# Patient Record
Sex: Female | Born: 1965 | Race: Black or African American | Hispanic: No | Marital: Single
Health system: Southern US, Community
[De-identification: ages and names within clinical notes are randomized; demographics above are authoritative.]

---

## 2013-10-15 ENCOUNTER — Ambulatory Visit: Payer: Self-pay | Admitting: Family Medicine

## 2016-03-28 ENCOUNTER — Other Ambulatory Visit: Payer: Self-pay | Admitting: Family Medicine

## 2016-03-28 DIAGNOSIS — Z1239 Encounter for other screening for malignant neoplasm of breast: Secondary | ICD-10-CM

## 2016-04-01 ENCOUNTER — Ambulatory Visit
Admission: RE | Admit: 2016-04-01 | Discharge: 2016-04-01 | Disposition: A | Discharge status: Home / Self Care | Payer: Medicaid Other | Source: Ambulatory Visit | Attending: Family Medicine | Admitting: Family Medicine

## 2016-04-01 DIAGNOSIS — Z1231 Encounter for screening mammogram for malignant neoplasm of breast: Secondary | ICD-10-CM | POA: Diagnosis not present

## 2016-04-01 DIAGNOSIS — Z1239 Encounter for other screening for malignant neoplasm of breast: Secondary | ICD-10-CM

## 2017-03-30 ENCOUNTER — Other Ambulatory Visit: Payer: Self-pay | Admitting: Family Medicine

## 2017-04-04 ENCOUNTER — Other Ambulatory Visit: Payer: Self-pay | Admitting: Family Medicine

## 2017-04-04 DIAGNOSIS — Z1239 Encounter for other screening for malignant neoplasm of breast: Secondary | ICD-10-CM

## 2017-05-18 ENCOUNTER — Ambulatory Visit
Admission: RE | Admit: 2017-05-18 | Discharge: 2017-05-18 | Disposition: A | Discharge status: Home / Self Care | Payer: Medicaid Other | Source: Ambulatory Visit | Attending: Family Medicine | Admitting: Family Medicine

## 2017-05-18 ENCOUNTER — Encounter: Payer: Self-pay | Admitting: Radiology

## 2017-05-18 DIAGNOSIS — Z1231 Encounter for screening mammogram for malignant neoplasm of breast: Secondary | ICD-10-CM | POA: Insufficient documentation

## 2017-05-18 DIAGNOSIS — Z1239 Encounter for other screening for malignant neoplasm of breast: Secondary | ICD-10-CM

## 2018-04-12 ENCOUNTER — Other Ambulatory Visit: Payer: Self-pay | Admitting: Family Medicine

## 2018-04-12 DIAGNOSIS — Z1231 Encounter for screening mammogram for malignant neoplasm of breast: Secondary | ICD-10-CM

## 2018-05-21 ENCOUNTER — Ambulatory Visit
Admission: RE | Admit: 2018-05-21 | Discharge: 2018-05-21 | Disposition: A | Discharge status: Home / Self Care | Payer: Medicaid Other | Source: Ambulatory Visit | Attending: Family Medicine | Admitting: Family Medicine

## 2018-05-21 DIAGNOSIS — Z1231 Encounter for screening mammogram for malignant neoplasm of breast: Secondary | ICD-10-CM | POA: Insufficient documentation

## 2018-11-13 ENCOUNTER — Emergency Department: Payer: Medicaid Other

## 2018-11-13 ENCOUNTER — Encounter: Payer: Self-pay | Admitting: *Deleted

## 2018-11-13 ENCOUNTER — Other Ambulatory Visit: Payer: Self-pay

## 2018-11-13 ENCOUNTER — Emergency Department
Admission: EM | Admit: 2018-11-13 | Discharge: 2018-11-14 | Disposition: A | Discharge status: Other Acute Inpatient Hospital | Payer: Medicaid Other | Attending: Emergency Medicine | Admitting: Emergency Medicine

## 2018-11-13 DIAGNOSIS — K661 Hemoperitoneum: Secondary | ICD-10-CM | POA: Insufficient documentation

## 2018-11-13 DIAGNOSIS — R578 Other shock: Secondary | ICD-10-CM

## 2018-11-13 DIAGNOSIS — R079 Chest pain, unspecified: Secondary | ICD-10-CM | POA: Diagnosis present

## 2018-11-13 DIAGNOSIS — R1012 Left upper quadrant pain: Secondary | ICD-10-CM | POA: Diagnosis not present

## 2018-11-13 LAB — BASIC METABOLIC PANEL
Anion gap: 13 (ref 5–15)
BUN: 13 mg/dL (ref 6–20)
CO2: 16 mmol/L — ABNORMAL LOW (ref 22–32)
Calcium: 8.3 mg/dL — ABNORMAL LOW (ref 8.9–10.3)
Chloride: 110 mmol/L (ref 98–111)
Creatinine, Ser: 1.04 mg/dL — ABNORMAL HIGH (ref 0.44–1.00)
GFR calc Af Amer: >60 mL/min (ref >60)
GFR calc non Af Amer: >60 mL/min (ref >60)
Glucose, Bld: 150 mg/dL — ABNORMAL HIGH (ref 70–99)
Potassium: 3.3 mmol/L — ABNORMAL LOW (ref 3.5–5.1)
Sodium: 139 mmol/L (ref 135–145)

## 2018-11-13 LAB — HCG, QUANTITATIVE, PREGNANCY: hCG, Beta Chain, Quant, S: 1 m[IU]/mL (ref <5)

## 2018-11-13 LAB — CBC
HCT: 31 % — ABNORMAL LOW (ref 36.0–46.0)
Hemoglobin: 10.8 g/dL — ABNORMAL LOW (ref 12.0–15.0)
MCH: 38.3 pg — ABNORMAL HIGH (ref 26.0–34.0)
MCHC: 34.8 g/dL (ref 30.0–36.0)
MCV: 109.9 fL — ABNORMAL HIGH (ref 80.0–100.0)
Platelets: 359 10*3/uL (ref 150–400)
RBC: 2.82 MIL/uL — ABNORMAL LOW (ref 3.87–5.11)
RDW: 12.6 % (ref 11.5–15.5)
WBC: 21.6 10*3/uL — ABNORMAL HIGH (ref 4.0–10.5)
nRBC: 0.1 % (ref 0.0–0.2)

## 2018-11-13 LAB — LIPASE, BLOOD: Lipase: 35 U/L (ref 11–51)

## 2018-11-13 MED ORDER — IOHEXOL 300 MG/ML  SOLN
100.0000 mL | Freq: Once | INTRAMUSCULAR | Status: AC | PRN
  Administered 2018-11-13: 100 mL via INTRAVENOUS

## 2018-11-13 MED ORDER — SODIUM CHLORIDE 0.9 % IV BOLUS
1000.0000 mL | Freq: Once | INTRAVENOUS | Status: AC
  Administered 2018-11-13: 1000 mL via INTRAVENOUS

## 2018-11-13 MED ORDER — FENTANYL CITRATE (PF) 100 MCG/2ML IJ SOLN
INTRAMUSCULAR | Status: AC
  Administered 2018-11-13: 50 ug via INTRAVENOUS
  Filled 2018-11-13: qty 2

## 2018-11-13 MED ORDER — FENTANYL CITRATE (PF) 100 MCG/2ML IJ SOLN: 50.0000 ug | Freq: Once | INTRAMUSCULAR | Status: DC

## 2018-11-13 MED ORDER — FENTANYL CITRATE (PF) 100 MCG/2ML IJ SOLN
50.0000 ug | Freq: Once | INTRAMUSCULAR | Status: AC
  Administered 2018-11-13: 50 ug via INTRAVENOUS

## 2018-11-13 NOTE — ED Notes (Addendum)
Dr Joni Fears notified of BP 92/69 and patient reporting pain.

## 2018-11-13 NOTE — ED Notes (Signed)
Site cleaned with betadine. Blood obtained after 2 attempts. Tourniquet was applied and skin cleaned for 30 seconds with betadine. Site allowed to dry and blood obtained. RN present while blood was then given to officers previously documented and sealed per their protocols. Pt was willing throughout the entire blood draw and calm.

## 2018-11-13 NOTE — ED Notes (Signed)
Pt transported to CT ?
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
## 2018-11-13 NOTE — ED Notes (Signed)
[place] [employer] officers Smith Mince and G.W. Snell at bedside with warrant to draw patients blood.

## 2018-11-13 NOTE — ED Notes (Signed)
Informed Dr. Joni Fears of low blood pressure, new order for NS 1L Bolus, pt is alert at this time and is requesting pain medication. Informed her that her BP is low and that she cannot have any more meds until her BP is better.
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
## 2018-11-13 NOTE — ED Notes (Signed)
Abrasion to left wrist cleaned no glass or gravel found in wound. Bleeding under control. Small 0.5 skin tear to the right side of patients nose also cleaned. No glass or gravel found in wound. Bleeding under control.

## 2018-11-14 LAB — ETHANOL: Alcohol, Ethyl (B): 177 mg/dL — ABNORMAL HIGH (ref <10)

## 2018-11-14 LAB — PREPARE RBC (CROSSMATCH)

## 2018-11-14 MED ORDER — GENERIC EXTERNAL MEDICATION
15.00 | Status: DC
Start: ? — End: 2018-11-14

## 2018-11-14 MED ORDER — GENERIC EXTERNAL MEDICATION
1.00 | Status: DC
Start: ? — End: 2018-11-14

## 2018-11-14 MED ORDER — PROPOFOL 100 MG/10ML IV EMUL
0.00 | INTRAVENOUS | Status: DC
Start: ? — End: 2018-11-14

## 2018-11-14 MED ORDER — GABAPENTIN 300 MG PO CAPS
300.00 | ORAL_CAPSULE | ORAL | Status: DC
Start: 2018-11-14 — End: 2018-11-14

## 2018-11-14 MED ORDER — MORPHINE SULFATE (PF) 4 MG/ML IV SOLN
4.0000 mg | Freq: Once | INTRAVENOUS | Status: AC
  Administered 2018-11-14: 4 mg via INTRAVENOUS

## 2018-11-14 MED ORDER — ENOXAPARIN SODIUM 30 MG/0.3ML ~~LOC~~ SOLN
30.00 | SUBCUTANEOUS | Status: DC
Start: 2018-11-14 — End: 2018-11-14

## 2018-11-14 MED ORDER — LIDOCAINE 5 % EX PTCH
2.00 | MEDICATED_PATCH | CUTANEOUS | Status: DC
Start: 2018-11-15 — End: 2018-11-14

## 2018-11-14 MED ORDER — ONDANSETRON HCL 4 MG/2ML IJ SOLN
4.00 | INTRAMUSCULAR | Status: DC
Start: ? — End: 2018-11-14

## 2018-11-14 MED ORDER — ACETAMINOPHEN 500 MG PO TABS
1000.00 | ORAL_TABLET | ORAL | Status: DC
Start: 2018-11-14 — End: 2018-11-14

## 2018-11-14 MED ORDER — MORPHINE SULFATE (PF) 4 MG/ML IV SOLN
INTRAVENOUS | Status: AC
  Administered 2018-11-14: 4 mg via INTRAVENOUS
  Filled 2018-11-14: qty 1

## 2018-11-14 MED ORDER — LACTATED RINGERS IV SOLN
100.00 | INTRAVENOUS | Status: DC
Start: ? — End: 2018-11-14

## 2018-11-14 MED ORDER — GENERIC EXTERNAL MEDICATION
Status: DC
Start: ? — End: 2018-11-14

## 2018-11-14 MED ORDER — CEFEPIME HCL 2 GM/100ML IV SOLN
2.00 | INTRAVENOUS | Status: DC
Start: 2018-11-14 — End: 2018-11-14

## 2018-11-14 MED ORDER — GENERIC EXTERNAL MEDICATION
.04 | Status: DC
Start: ? — End: 2018-11-14

## 2018-11-14 MED ORDER — FAMOTIDINE 20 MG/2ML IV SOLN
20.00 | INTRAVENOUS | Status: DC
Start: 2018-11-14 — End: 2018-11-14

## 2018-11-14 MED ORDER — METRONIDAZOLE IN NACL 5-0.79 MG/ML-% IV SOLN
500.00 | INTRAVENOUS | Status: DC
Start: 2018-11-14 — End: 2018-11-14

## 2018-11-14 MED ORDER — MORPHINE SULFATE (PF) 2 MG/ML IV SOLN
2.0000 mg | Freq: Once | INTRAVENOUS | Status: AC
  Administered 2018-11-14: 2 mg via INTRAVENOUS
  Filled 2018-11-14: qty 1

## 2018-11-14 MED ORDER — NALOXONE HCL 0.4 MG/ML IJ SOLN
0.40 | INTRAMUSCULAR | Status: DC
Start: ? — End: 2018-11-14

## 2018-11-14 MED ORDER — FENTANYL CITRATE 2500 MCG/50ML IV SOLN
0.00 | INTRAVENOUS | Status: DC
Start: ? — End: 2018-11-14

## 2018-11-14 MED ORDER — POTASSIUM CHLORIDE 20 MEQ/100ML IV SOLN
20.00 | INTRAVENOUS | Status: DC
Start: ? — End: 2018-11-14

## 2018-11-14 MED ORDER — ONDANSETRON HCL 4 MG/2ML IJ SOLN
4.0000 mg | Freq: Once | INTRAMUSCULAR | Status: AC
  Administered 2018-11-14: 4 mg via INTRAVENOUS
  Filled 2018-11-14: qty 2

## 2018-11-14 MED ORDER — MAGNESIUM SULFATE 2 GM/50ML IV SOLN
2.00 | INTRAVENOUS | Status: DC
Start: ? — End: 2018-11-14

## 2018-11-14 MED ORDER — INSULIN REGULAR HUMAN 100 UNIT/ML IJ SOLN
0.00 | INTRAMUSCULAR | Status: DC
Start: 2018-11-14 — End: 2018-11-14

## 2018-11-14 MED ORDER — NOREPINEPHRINE-SODIUM CHLORIDE 8-0.9 MG/250ML-% IV SOLN
2.00 | INTRAVENOUS | Status: DC
Start: ? — End: 2018-11-14

## 2018-11-14 MED ORDER — DEXTROSE 50 % IV SOLN
12.50 | INTRAVENOUS | Status: DC
Start: ? — End: 2018-11-14

## 2018-11-14 NOTE — ED Notes (Signed)
Pt transferred to [facility], second unit of blood hung and will be transported with pt via EMS with RN present. IVF continue to infuse as well, about 500cc remaining in each bag upon departure.

## 2018-11-14 NOTE — ED Notes (Signed)
Attempted IV access in RFA x 2 by this RN, unsuccessful attempts.

## 2018-11-14 NOTE — ED Notes (Signed)
Emergency blood verified with Raquel RN

## 2018-11-14 NOTE — ED Notes (Signed)
Blood increased to 999/hr
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
## 2018-11-15 LAB — TYPE AND SCREEN
ABO/RH(D): O POS
Antibody Screen: NEGATIVE
Unit division: 0
Unit division: 0

## 2018-11-15 LAB — BPAM RBC
Blood Product Expiration Date: 202006172359
Blood Product Expiration Date: 202006212359
ISSUE DATE / TIME: 202006100019
ISSUE DATE / TIME: 202006100019
Unit Type and Rh: 9500
Unit Type and Rh: 9500

## 2019-07-17 ENCOUNTER — Other Ambulatory Visit: Payer: Self-pay | Admitting: Physician Assistant

## 2019-07-17 DIAGNOSIS — Z1231 Encounter for screening mammogram for malignant neoplasm of breast: Secondary | ICD-10-CM

## 2019-07-23 ENCOUNTER — Ambulatory Visit
Admission: RE | Admit: 2019-07-23 | Discharge: 2019-07-23 | Disposition: A | Discharge status: Home / Self Care | Payer: Medicaid Other | Source: Ambulatory Visit | Attending: Physician Assistant | Admitting: Physician Assistant

## 2019-07-23 ENCOUNTER — Other Ambulatory Visit: Payer: Self-pay

## 2019-07-23 DIAGNOSIS — Z1231 Encounter for screening mammogram for malignant neoplasm of breast: Secondary | ICD-10-CM | POA: Insufficient documentation
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
## 2020-10-09 ENCOUNTER — Encounter (HOSPITAL_COMMUNITY): Payer: Self-pay | Admitting: Emergency Medicine

## 2020-10-09 ENCOUNTER — Emergency Department (HOSPITAL_COMMUNITY)
Admission: EM | Admit: 2020-10-09 | Discharge: 2020-10-09 | Disposition: A | Discharge status: Home / Self Care | Payer: Medicaid Other | Attending: Emergency Medicine | Admitting: Emergency Medicine

## 2020-10-09 ENCOUNTER — Other Ambulatory Visit: Payer: Self-pay

## 2020-10-09 DIAGNOSIS — M25561 Pain in right knee: Secondary | ICD-10-CM | POA: Insufficient documentation

## 2020-10-09 DIAGNOSIS — M25511 Pain in right shoulder: Secondary | ICD-10-CM | POA: Insufficient documentation

## 2020-10-09 DIAGNOSIS — M5431 Sciatica, right side: Secondary | ICD-10-CM | POA: Diagnosis not present

## 2020-10-09 DIAGNOSIS — Y92410 Unspecified street and highway as the place of occurrence of the external cause: Secondary | ICD-10-CM | POA: Diagnosis not present

## 2020-10-09 MED ORDER — DICLOFENAC SODIUM 1 % EX GEL: 2.0000 g | Freq: Four times a day (QID) | CUTANEOUS | 0 refills | Status: AC

## 2020-10-09 MED ORDER — ORPHENADRINE CITRATE ER 100 MG PO TB12: 100.0000 mg | ORAL_TABLET | Freq: Two times a day (BID) | ORAL | 0 refills | Status: AC
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
## 2020-10-09 NOTE — Discharge Instructions (Addendum)
Recheck with your doctor if not improving after 3 to 5 days. Apply Voltaren gel to joint pain.  Take Norflex as needed as prescribed for muscle tightness. Can apply warm compresses to sore muscles for 20 minutes at a time followed by gentle stretching.
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
